# Patient Record
Sex: Female | Born: 1955
Health system: Southern US, Community
[De-identification: ages and names within clinical notes are randomized; demographics above are authoritative.]

## PROBLEM LIST (undated history)

## (undated) DIAGNOSIS — J42 Unspecified chronic bronchitis: Secondary | ICD-10-CM

## (undated) DIAGNOSIS — E782 Mixed hyperlipidemia: Secondary | ICD-10-CM

## (undated) DIAGNOSIS — Z8619 Personal history of other infectious and parasitic diseases: Secondary | ICD-10-CM

## (undated) DIAGNOSIS — E039 Hypothyroidism, unspecified: Secondary | ICD-10-CM

## (undated) DIAGNOSIS — R7303 Prediabetes: Secondary | ICD-10-CM

## (undated) HISTORY — DX: Unspecified chronic bronchitis: J42

## (undated) HISTORY — DX: Hypothyroidism, unspecified: E03.9

## (undated) HISTORY — DX: Prediabetes: R73.03

## (undated) HISTORY — DX: Mixed hyperlipidemia: E78.2

## (undated) HISTORY — DX: Personal history of other infectious and parasitic diseases: Z86.19

---

## 1997-10-17 HISTORY — PX: CERVICAL SPINE SURGERY: SHX589

## 1999-02-19 ENCOUNTER — Other Ambulatory Visit: Admission: RE | Admit: 1999-02-19 | Discharge: 1999-02-19 | Payer: Self-pay | Admitting: Obstetrics and Gynecology

## 2000-02-29 ENCOUNTER — Encounter: Admission: RE | Admit: 2000-02-29 | Discharge: 2000-02-29 | Payer: Self-pay | Admitting: Obstetrics and Gynecology

## 2000-02-29 ENCOUNTER — Encounter: Payer: Self-pay | Admitting: Obstetrics and Gynecology

## 2000-03-03 ENCOUNTER — Encounter: Payer: Self-pay | Admitting: Obstetrics and Gynecology

## 2000-03-03 ENCOUNTER — Encounter: Admission: RE | Admit: 2000-03-03 | Discharge: 2000-03-03 | Payer: Self-pay | Admitting: Obstetrics and Gynecology

## 2000-03-21 ENCOUNTER — Other Ambulatory Visit: Admission: RE | Admit: 2000-03-21 | Discharge: 2000-03-21 | Payer: Self-pay | Admitting: Family Medicine

## 2000-09-18 ENCOUNTER — Encounter: Payer: Self-pay | Admitting: Family Medicine

## 2000-09-18 ENCOUNTER — Encounter: Admission: RE | Admit: 2000-09-18 | Discharge: 2000-09-18 | Payer: Self-pay | Admitting: Family Medicine

## 2000-11-09 ENCOUNTER — Encounter: Admission: RE | Admit: 2000-11-09 | Discharge: 2000-11-09 | Payer: Self-pay | Admitting: Otolaryngology

## 2000-11-09 ENCOUNTER — Encounter: Payer: Self-pay | Admitting: Otolaryngology

## 2001-05-10 ENCOUNTER — Encounter: Payer: Self-pay | Admitting: Obstetrics and Gynecology

## 2001-05-10 ENCOUNTER — Encounter: Admission: RE | Admit: 2001-05-10 | Discharge: 2001-05-10 | Payer: Self-pay | Admitting: Obstetrics and Gynecology

## 2001-05-29 ENCOUNTER — Other Ambulatory Visit: Admission: RE | Admit: 2001-05-29 | Discharge: 2001-05-29 | Payer: Self-pay | Admitting: Obstetrics and Gynecology

## 2001-07-13 ENCOUNTER — Ambulatory Visit (HOSPITAL_COMMUNITY): Admission: RE | Admit: 2001-07-13 | Discharge: 2001-07-13 | Payer: Self-pay | Admitting: Obstetrics and Gynecology

## 2002-05-14 ENCOUNTER — Encounter: Admission: RE | Admit: 2002-05-14 | Discharge: 2002-05-14 | Payer: Self-pay | Admitting: Obstetrics and Gynecology

## 2002-05-14 ENCOUNTER — Encounter: Payer: Self-pay | Admitting: Obstetrics and Gynecology

## 2003-03-06 ENCOUNTER — Other Ambulatory Visit: Admission: RE | Admit: 2003-03-06 | Discharge: 2003-03-06 | Payer: Self-pay | Admitting: Obstetrics and Gynecology

## 2003-05-26 ENCOUNTER — Encounter: Admission: RE | Admit: 2003-05-26 | Discharge: 2003-05-26 | Payer: Self-pay | Admitting: Family Medicine

## 2003-05-26 ENCOUNTER — Encounter: Payer: Self-pay | Admitting: Family Medicine

## 2003-12-11 ENCOUNTER — Ambulatory Visit (HOSPITAL_COMMUNITY): Admission: RE | Admit: 2003-12-11 | Discharge: 2003-12-11 | Payer: Self-pay | Admitting: Neurosurgery

## 2004-06-22 ENCOUNTER — Other Ambulatory Visit: Admission: RE | Admit: 2004-06-22 | Discharge: 2004-06-22 | Payer: Self-pay | Admitting: Obstetrics and Gynecology

## 2004-08-30 ENCOUNTER — Encounter: Admission: RE | Admit: 2004-08-30 | Discharge: 2004-08-30 | Payer: Self-pay | Admitting: Obstetrics and Gynecology

## 2005-06-29 ENCOUNTER — Other Ambulatory Visit: Admission: RE | Admit: 2005-06-29 | Discharge: 2005-06-29 | Payer: Self-pay | Admitting: Obstetrics and Gynecology

## 2005-10-24 ENCOUNTER — Encounter: Admission: RE | Admit: 2005-10-24 | Discharge: 2005-10-24 | Payer: Self-pay | Admitting: Obstetrics and Gynecology

## 2006-11-02 ENCOUNTER — Encounter: Admission: RE | Admit: 2006-11-02 | Discharge: 2006-11-02 | Payer: Self-pay | Admitting: Obstetrics and Gynecology

## 2007-11-21 ENCOUNTER — Encounter: Admission: RE | Admit: 2007-11-21 | Discharge: 2007-11-21 | Payer: Self-pay | Admitting: Obstetrics and Gynecology

## 2008-11-24 ENCOUNTER — Encounter: Admission: RE | Admit: 2008-11-24 | Discharge: 2008-11-24 | Payer: Self-pay | Admitting: Obstetrics and Gynecology

## 2009-12-09 ENCOUNTER — Encounter: Admission: RE | Admit: 2009-12-09 | Discharge: 2009-12-09 | Payer: Self-pay | Admitting: Obstetrics and Gynecology

## 2010-12-23 ENCOUNTER — Other Ambulatory Visit: Payer: Self-pay | Admitting: Family Medicine

## 2010-12-23 DIAGNOSIS — Z1231 Encounter for screening mammogram for malignant neoplasm of breast: Secondary | ICD-10-CM

## 2010-12-30 ENCOUNTER — Ambulatory Visit
Admission: RE | Admit: 2010-12-30 | Discharge: 2010-12-30 | Disposition: A | Discharge status: Home / Self Care | Payer: BC Managed Care – PPO | Source: Ambulatory Visit | Attending: Family Medicine | Admitting: Family Medicine

## 2010-12-30 DIAGNOSIS — Z1231 Encounter for screening mammogram for malignant neoplasm of breast: Secondary | ICD-10-CM

## 2011-03-04 NOTE — Op Note (Signed)
[facility]  Patient:    Katherine Jackson, Katherine Jackson Visit Number: [id number] MRN: [id number]          Service Type: DSU Location: [facility] Attending Physician:  Miguel Aschoff Dictated by:   Miguel Aschoff, M.D. Proc. Date: 07/13/01 Admit Date:  07/13/2001                             Operative Report  PREOPERATIVE DIAGNOSES:       1. Chronic pelvic pain with dysmenorrhea.                               2. Desired sterilization.  POSTOPERATIVE DIAGNOSES:      1. Chronic pelvic pain with dysmenorrhea.                               2. Desired sterilization.  PROCEDURE:                    Diagnostic laparoscopy with bilateral tubal sterilization using cautery and division, laser uterosacral nerve ablation.  SURGEON:                      Miguel Aschoff, M.D.  ANESTHESIA:                   General.  COMPLICATIONS:                None.  JUSTIFICATION:                The patient is a 55 year old [place] female with a history of progressive dysmenorrhea that has not responded to outpatient therapy.  Because of her persistent symptoms she has requested that a definitive procedure be carried out to see if an etiology for the pain can be established and corrected.  In addition, she has requested that a tubal sterilization procedure be performed and that she now desires permanent sterilization.  Informed consent has been obtained for both procedures.  PROCEDURE:                    Patient was taken to the operating room, placed in a supine position, and general anesthesia was administered without difficulty.  She was then placed in the dorsal lithotomy position and prepped and draped in the usual sterile fashion.  A Hulka tenaculum was placed through the cervix and held.  The bladder was catheterized.  At this point a small infraumbilical incision was made.  The Veress needle was inserted and the abdomen was insufflated with 3 L of CO2 under monitored pressure.  Following this a  trocar was used and a subumbilical port established for the laparoscope.  Once in the abdomen a second puncture was created suprapubically under direct visualization with 5 mm port being established.  Systematic inspection of the pelvic organs showed the interior bladder/peritoneum to be unremarkable.  The round ligaments were identified and traced out to their exit points in the abdomen.  There appeared to be defects at the exit points consistent with small inguinal hernias, the right one being greater in size than the left.  The tubes were traced along their course.  The tubes were normal.  The fimbria were fine and delicate.  The ovaries were inspected and appeared to be unremarkable except for some filmy adhesions  present on the left ovary between the left ovary and the left tube.  These adhesions were ______ lysed.  The cul-de-sac was inspected.  There were no obvious lesions in the cul-de-sac.  The uterosacral ligaments were inspected.  There did not appear to be any obvious abnormality with the uterosacral ligaments.  The appendix was visualized as was the cecum and this appeared to be totally within normal limits.  The liver surface was visualized and also appeared to be within normal limits.  The intestinal surfaces were unremarkable.  At this point in an effort to try to improve the patients pain and with an ultrasound suggestive of adenomyosis the uterosacral ligaments were identified, placed on stretch, and then with 15 watts of laser power with a GRP4 laser tip the ligaments were partially transected.  This was done with care to avoid any injury to the ureter and with care to not create any hemorrhage.  This was done successfully.  Then attention was directed to the mid portion of each tube where using bipolar cautery the mid portion of each tube was cauterized for approximately 3 cm and then divided with scissors and the mid portion of the area cauterization with good  separation.  On the right side there was a small amount of oozing which was created on division of the tubes and this was brought under control using bipolar cautery without difficulty.  At this point with no other abnormalities being noted the procedure was completed.  The CO2 was allowed to escape.  The small incisions were closed using subcuticular 4-0 Vicryl at the umbilicus and Dermabond at the suprapubic incision.  The patient was reversed from the anesthetic and taken to the recovery room in satisfactory condition.  The estimated blood loss was less than 20 cc.  PLAN:                         Patient is to be discharged home.  She will be seen back in four weeks for followup examination.  She is to call for her intraoperative findings on Tuesday.  DISCHARGE MEDICATIONS:        1. Doxycycline 100 mg b.i.d. x 3 days.                               2. Tylox one q.3h. p.r.n. pain.  DISCHARGE INSTRUCTIONS:       She is instructed to also have no sex for two weeks to allow the tubal sterilization to heal. Dictated by:   Miguel Aschoff, M.D. Attending Physician:  Miguel Aschoff DD:  07/13/01 TD:  07/13/01 Job: [id number]

## 2012-01-17 ENCOUNTER — Other Ambulatory Visit: Payer: Self-pay | Admitting: Family Medicine

## 2012-01-17 DIAGNOSIS — Z1231 Encounter for screening mammogram for malignant neoplasm of breast: Secondary | ICD-10-CM

## 2012-01-26 ENCOUNTER — Ambulatory Visit
Admission: RE | Admit: 2012-01-26 | Discharge: 2012-01-26 | Disposition: A | Discharge status: Home / Self Care | Payer: BC Managed Care – PPO | Source: Ambulatory Visit | Attending: Family Medicine | Admitting: Family Medicine

## 2012-01-26 ENCOUNTER — Ambulatory Visit: Payer: BC Managed Care – PPO

## 2012-01-26 DIAGNOSIS — Z1231 Encounter for screening mammogram for malignant neoplasm of breast: Secondary | ICD-10-CM

## 2013-02-06 ENCOUNTER — Other Ambulatory Visit: Payer: Self-pay

## 2013-02-06 DIAGNOSIS — Z1231 Encounter for screening mammogram for malignant neoplasm of breast: Secondary | ICD-10-CM

## 2013-02-26 ENCOUNTER — Ambulatory Visit
Admission: RE | Admit: 2013-02-26 | Discharge: 2013-02-26 | Disposition: A | Discharge status: Home / Self Care | Payer: BC Managed Care – PPO | Source: Ambulatory Visit

## 2013-02-26 DIAGNOSIS — Z1231 Encounter for screening mammogram for malignant neoplasm of breast: Secondary | ICD-10-CM

## 2013-10-17 HISTORY — PX: BACK SURGERY: SHX140

## 2013-12-11 ENCOUNTER — Other Ambulatory Visit: Payer: Self-pay | Admitting: Family Medicine

## 2013-12-11 DIAGNOSIS — M545 Low back pain, unspecified: Secondary | ICD-10-CM

## 2013-12-18 ENCOUNTER — Other Ambulatory Visit: Payer: BC Managed Care – PPO

## 2013-12-23 ENCOUNTER — Other Ambulatory Visit: Payer: Self-pay | Admitting: Family Medicine

## 2013-12-23 ENCOUNTER — Ambulatory Visit
Admission: RE | Admit: 2013-12-23 | Discharge: 2013-12-23 | Disposition: A | Discharge status: Home / Self Care | Payer: BC Managed Care – PPO | Source: Ambulatory Visit | Attending: Family Medicine | Admitting: Family Medicine

## 2013-12-23 DIAGNOSIS — M7918 Myalgia, other site: Secondary | ICD-10-CM

## 2013-12-23 DIAGNOSIS — M545 Low back pain, unspecified: Secondary | ICD-10-CM

## 2014-01-01 ENCOUNTER — Other Ambulatory Visit: Payer: Self-pay | Admitting: Chiropractor

## 2014-01-01 DIAGNOSIS — M79606 Pain in leg, unspecified: Secondary | ICD-10-CM

## 2014-01-02 ENCOUNTER — Ambulatory Visit
Admission: RE | Admit: 2014-01-02 | Discharge: 2014-01-02 | Disposition: A | Discharge status: Home / Self Care | Payer: BC Managed Care – PPO | Source: Ambulatory Visit | Attending: Chiropractor | Admitting: Chiropractor

## 2014-01-02 DIAGNOSIS — M79606 Pain in leg, unspecified: Secondary | ICD-10-CM

## 2014-01-05 ENCOUNTER — Other Ambulatory Visit: Payer: BC Managed Care – PPO

## 2014-02-25 ENCOUNTER — Other Ambulatory Visit: Payer: Self-pay

## 2014-02-25 DIAGNOSIS — Z1231 Encounter for screening mammogram for malignant neoplasm of breast: Secondary | ICD-10-CM

## 2014-02-28 ENCOUNTER — Ambulatory Visit
Admission: RE | Admit: 2014-02-28 | Discharge: 2014-02-28 | Disposition: A | Discharge status: Home / Self Care | Payer: BC Managed Care – PPO | Source: Ambulatory Visit

## 2014-02-28 ENCOUNTER — Encounter (INDEPENDENT_AMBULATORY_CARE_PROVIDER_SITE_OTHER): Payer: Self-pay

## 2014-02-28 DIAGNOSIS — Z1231 Encounter for screening mammogram for malignant neoplasm of breast: Secondary | ICD-10-CM

## 2014-09-04 ENCOUNTER — Encounter (HOSPITAL_COMMUNITY): Payer: Self-pay

## 2014-09-04 ENCOUNTER — Ambulatory Visit (HOSPITAL_COMMUNITY)
Admission: RE | Admit: 2014-09-04 | Discharge: 2014-09-04 | Disposition: A | Discharge status: Home / Self Care | Payer: BC Managed Care – PPO | Source: Ambulatory Visit | Attending: Family Medicine | Admitting: Family Medicine

## 2014-09-04 ENCOUNTER — Ambulatory Visit
Admission: RE | Admit: 2014-09-04 | Discharge: 2014-09-04 | Disposition: A | Discharge status: Home / Self Care | Payer: BC Managed Care – PPO | Source: Ambulatory Visit | Attending: Physician Assistant | Admitting: Physician Assistant

## 2014-09-04 ENCOUNTER — Other Ambulatory Visit: Payer: Self-pay | Admitting: Family Medicine

## 2014-09-04 ENCOUNTER — Other Ambulatory Visit: Payer: Self-pay | Admitting: Physician Assistant

## 2014-09-04 ENCOUNTER — Encounter (INDEPENDENT_AMBULATORY_CARE_PROVIDER_SITE_OTHER): Payer: Self-pay

## 2014-09-04 DIAGNOSIS — R0602 Shortness of breath: Secondary | ICD-10-CM

## 2014-09-04 DIAGNOSIS — R0789 Other chest pain: Secondary | ICD-10-CM | POA: Diagnosis not present

## 2014-09-04 MED ORDER — IOHEXOL 350 MG/ML SOLN
100.0000 mL | Freq: Once | INTRAVENOUS | Status: AC | PRN
  Administered 2014-09-04: 100 mL via INTRAVENOUS

## 2014-09-10 ENCOUNTER — Other Ambulatory Visit: Payer: BC Managed Care – PPO

## 2015-01-23 ENCOUNTER — Other Ambulatory Visit: Payer: Self-pay

## 2015-01-23 DIAGNOSIS — Z1231 Encounter for screening mammogram for malignant neoplasm of breast: Secondary | ICD-10-CM

## 2015-01-23 DIAGNOSIS — Z803 Family history of malignant neoplasm of breast: Secondary | ICD-10-CM

## 2015-03-02 ENCOUNTER — Encounter (INDEPENDENT_AMBULATORY_CARE_PROVIDER_SITE_OTHER): Payer: Self-pay

## 2015-03-02 ENCOUNTER — Ambulatory Visit
Admission: RE | Admit: 2015-03-02 | Discharge: 2015-03-02 | Disposition: A | Discharge status: Home / Self Care | Payer: BLUE CROSS/BLUE SHIELD | Source: Ambulatory Visit

## 2015-03-02 DIAGNOSIS — Z803 Family history of malignant neoplasm of breast: Secondary | ICD-10-CM

## 2015-03-02 DIAGNOSIS — Z1231 Encounter for screening mammogram for malignant neoplasm of breast: Secondary | ICD-10-CM

## 2015-04-17 ENCOUNTER — Other Ambulatory Visit: Payer: Self-pay | Admitting: Family Medicine

## 2015-04-17 ENCOUNTER — Ambulatory Visit
Admission: RE | Admit: 2015-04-17 | Discharge: 2015-04-17 | Disposition: A | Discharge status: Home / Self Care | Payer: BLUE CROSS/BLUE SHIELD | Source: Ambulatory Visit | Attending: Family Medicine | Admitting: Family Medicine

## 2015-04-17 DIAGNOSIS — J219 Acute bronchiolitis, unspecified: Secondary | ICD-10-CM

## 2015-05-15 ENCOUNTER — Other Ambulatory Visit (HOSPITAL_COMMUNITY): Payer: Self-pay | Admitting: Respiratory Therapy

## 2015-05-15 DIAGNOSIS — J9809 Other diseases of bronchus, not elsewhere classified: Secondary | ICD-10-CM

## 2015-05-20 ENCOUNTER — Ambulatory Visit (HOSPITAL_COMMUNITY)
Admission: RE | Admit: 2015-05-20 | Discharge: 2015-05-20 | Disposition: A | Discharge status: Home / Self Care | Payer: BLUE CROSS/BLUE SHIELD | Source: Ambulatory Visit | Attending: Family Medicine | Admitting: Family Medicine

## 2015-05-20 DIAGNOSIS — J9801 Acute bronchospasm: Secondary | ICD-10-CM | POA: Insufficient documentation

## 2015-05-20 LAB — PULMONARY FUNCTION TEST
DL/VA % pred: 66 %
DL/VA: 3.47 ml/min/mmHg/L
DLCO UNC: 19.38 ml/min/mmHg
DLCO unc % pred: 65 %
FEF 25-75 POST: 2.29 L/s
FEF 25-75 Pre: 2.06 L/sec
FEF2575-%Change-Post: 11 %
FEF2575-%PRED-POST: 85 %
FEF2575-%PRED-PRE: 77 %
FEV1-%Change-Post: 4 %
FEV1-%PRED-POST: 95 %
FEV1-%PRED-PRE: 91 %
FEV1-POST: 2.86 L
FEV1-Pre: 2.74 L
FEV1FVC-%CHANGE-POST: 1 %
FEV1FVC-%Pred-Pre: 94 %
FEV6-%CHANGE-POST: 2 %
FEV6-%PRED-POST: 100 %
FEV6-%Pred-Pre: 98 %
FEV6-POST: 3.74 L
FEV6-Pre: 3.66 L
FEV6FVC-%Change-Post: 0 %
FEV6FVC-%PRED-PRE: 101 %
FEV6FVC-%Pred-Post: 100 %
FVC-%Change-Post: 3 %
FVC-%PRED-PRE: 96 %
FVC-%Pred-Post: 99 %
FVC-POST: 3.84 L
FVC-PRE: 3.73 L
PRE FEV1/FVC RATIO: 73 %
Post FEV1/FVC ratio: 75 %
Post FEV6/FVC ratio: 97 %
Pre FEV6/FVC Ratio: 98 %
RV % PRED: 80 %
RV: 1.74 L
TLC % PRED: 97 %
TLC: 5.53 L

## 2015-05-20 MED ORDER — ALBUTEROL SULFATE (2.5 MG/3ML) 0.083% IN NEBU
2.5000 mg | INHALATION_SOLUTION | Freq: Once | RESPIRATORY_TRACT | Status: AC
  Administered 2015-05-20: 2.5 mg via RESPIRATORY_TRACT

## 2015-09-16 ENCOUNTER — Other Ambulatory Visit: Payer: Self-pay | Admitting: Neurology

## 2015-09-16 MED ORDER — TRIAMCINOLONE ACETONIDE 55 MCG/ACT NA AERO: 1.0000 | INHALATION_SPRAY | Freq: Every day | NASAL | Status: DC

## 2016-02-19 ENCOUNTER — Other Ambulatory Visit: Payer: Self-pay

## 2016-02-19 DIAGNOSIS — Z1231 Encounter for screening mammogram for malignant neoplasm of breast: Secondary | ICD-10-CM

## 2016-03-02 ENCOUNTER — Ambulatory Visit: Payer: BLUE CROSS/BLUE SHIELD

## 2016-03-21 ENCOUNTER — Ambulatory Visit
Admission: RE | Admit: 2016-03-21 | Discharge: 2016-03-21 | Disposition: A | Discharge status: Home / Self Care | Payer: BLUE CROSS/BLUE SHIELD | Source: Ambulatory Visit

## 2016-03-21 DIAGNOSIS — Z1231 Encounter for screening mammogram for malignant neoplasm of breast: Secondary | ICD-10-CM

## 2016-03-23 ENCOUNTER — Other Ambulatory Visit: Payer: Self-pay | Admitting: Family Medicine

## 2016-03-23 DIAGNOSIS — R928 Other abnormal and inconclusive findings on diagnostic imaging of breast: Secondary | ICD-10-CM

## 2016-03-28 ENCOUNTER — Ambulatory Visit
Admission: RE | Admit: 2016-03-28 | Discharge: 2016-03-28 | Disposition: A | Discharge status: Home / Self Care | Payer: BLUE CROSS/BLUE SHIELD | Source: Ambulatory Visit | Attending: Family Medicine | Admitting: Family Medicine

## 2016-03-28 DIAGNOSIS — R928 Other abnormal and inconclusive findings on diagnostic imaging of breast: Secondary | ICD-10-CM

## 2016-08-24 ENCOUNTER — Other Ambulatory Visit: Payer: Self-pay | Admitting: Family Medicine

## 2016-08-24 DIAGNOSIS — N6489 Other specified disorders of breast: Secondary | ICD-10-CM

## 2016-09-20 ENCOUNTER — Ambulatory Visit
Admission: RE | Admit: 2016-09-20 | Discharge: 2016-09-20 | Disposition: A | Discharge status: Home / Self Care | Payer: BLUE CROSS/BLUE SHIELD | Source: Ambulatory Visit | Attending: Family Medicine | Admitting: Family Medicine

## 2016-09-20 DIAGNOSIS — N6489 Other specified disorders of breast: Secondary | ICD-10-CM

## 2016-11-03 DIAGNOSIS — M5432 Sciatica, left side: Secondary | ICD-10-CM | POA: Diagnosis not present

## 2016-11-03 DIAGNOSIS — M9903 Segmental and somatic dysfunction of lumbar region: Secondary | ICD-10-CM | POA: Diagnosis not present

## 2016-11-10 ENCOUNTER — Other Ambulatory Visit: Payer: Self-pay | Admitting: Physician Assistant

## 2016-11-10 ENCOUNTER — Ambulatory Visit
Admission: RE | Admit: 2016-11-10 | Discharge: 2016-11-10 | Disposition: A | Discharge status: Home / Self Care | Payer: 59 | Source: Ambulatory Visit | Attending: Physician Assistant | Admitting: Physician Assistant

## 2016-11-10 DIAGNOSIS — J069 Acute upper respiratory infection, unspecified: Secondary | ICD-10-CM

## 2016-11-10 DIAGNOSIS — R059 Cough, unspecified: Secondary | ICD-10-CM

## 2016-11-10 DIAGNOSIS — R05 Cough: Secondary | ICD-10-CM

## 2016-11-25 DIAGNOSIS — J329 Chronic sinusitis, unspecified: Secondary | ICD-10-CM | POA: Diagnosis not present

## 2016-12-12 DIAGNOSIS — R7301 Impaired fasting glucose: Secondary | ICD-10-CM | POA: Diagnosis not present

## 2016-12-12 DIAGNOSIS — E782 Mixed hyperlipidemia: Secondary | ICD-10-CM | POA: Diagnosis not present

## 2016-12-12 DIAGNOSIS — E038 Other specified hypothyroidism: Secondary | ICD-10-CM | POA: Diagnosis not present

## 2017-01-13 DIAGNOSIS — R197 Diarrhea, unspecified: Secondary | ICD-10-CM | POA: Diagnosis not present

## 2017-01-23 DIAGNOSIS — R05 Cough: Secondary | ICD-10-CM | POA: Diagnosis not present

## 2017-03-27 DIAGNOSIS — R5383 Other fatigue: Secondary | ICD-10-CM | POA: Diagnosis not present

## 2017-03-27 DIAGNOSIS — E039 Hypothyroidism, unspecified: Secondary | ICD-10-CM | POA: Diagnosis not present

## 2017-03-27 DIAGNOSIS — L659 Nonscarring hair loss, unspecified: Secondary | ICD-10-CM | POA: Diagnosis not present

## 2017-04-13 DIAGNOSIS — R05 Cough: Secondary | ICD-10-CM | POA: Diagnosis not present

## 2017-04-13 DIAGNOSIS — J3089 Other allergic rhinitis: Secondary | ICD-10-CM | POA: Diagnosis not present

## 2017-04-13 DIAGNOSIS — J4540 Moderate persistent asthma, uncomplicated: Secondary | ICD-10-CM | POA: Diagnosis not present

## 2017-05-02 DIAGNOSIS — E038 Other specified hypothyroidism: Secondary | ICD-10-CM | POA: Diagnosis not present

## 2017-05-12 ENCOUNTER — Other Ambulatory Visit: Payer: Self-pay | Admitting: Family Medicine

## 2017-05-12 DIAGNOSIS — Z1231 Encounter for screening mammogram for malignant neoplasm of breast: Secondary | ICD-10-CM

## 2017-05-17 ENCOUNTER — Ambulatory Visit
Admission: RE | Admit: 2017-05-17 | Discharge: 2017-05-17 | Disposition: A | Discharge status: Home / Self Care | Payer: 59 | Source: Ambulatory Visit | Attending: Family Medicine | Admitting: Family Medicine

## 2017-05-17 DIAGNOSIS — Z1231 Encounter for screening mammogram for malignant neoplasm of breast: Secondary | ICD-10-CM

## 2017-05-31 IMAGING — CR DG CHEST 2V
2 series · 2 of 2 positions shown · non-contrast
Comparison: Chest radiograph and chest CT September 04, 2014

CLINICAL DATA: Three-week history of cough.  Chest tightness

EXAM:
CHEST  2 VIEW

[w chest pa]
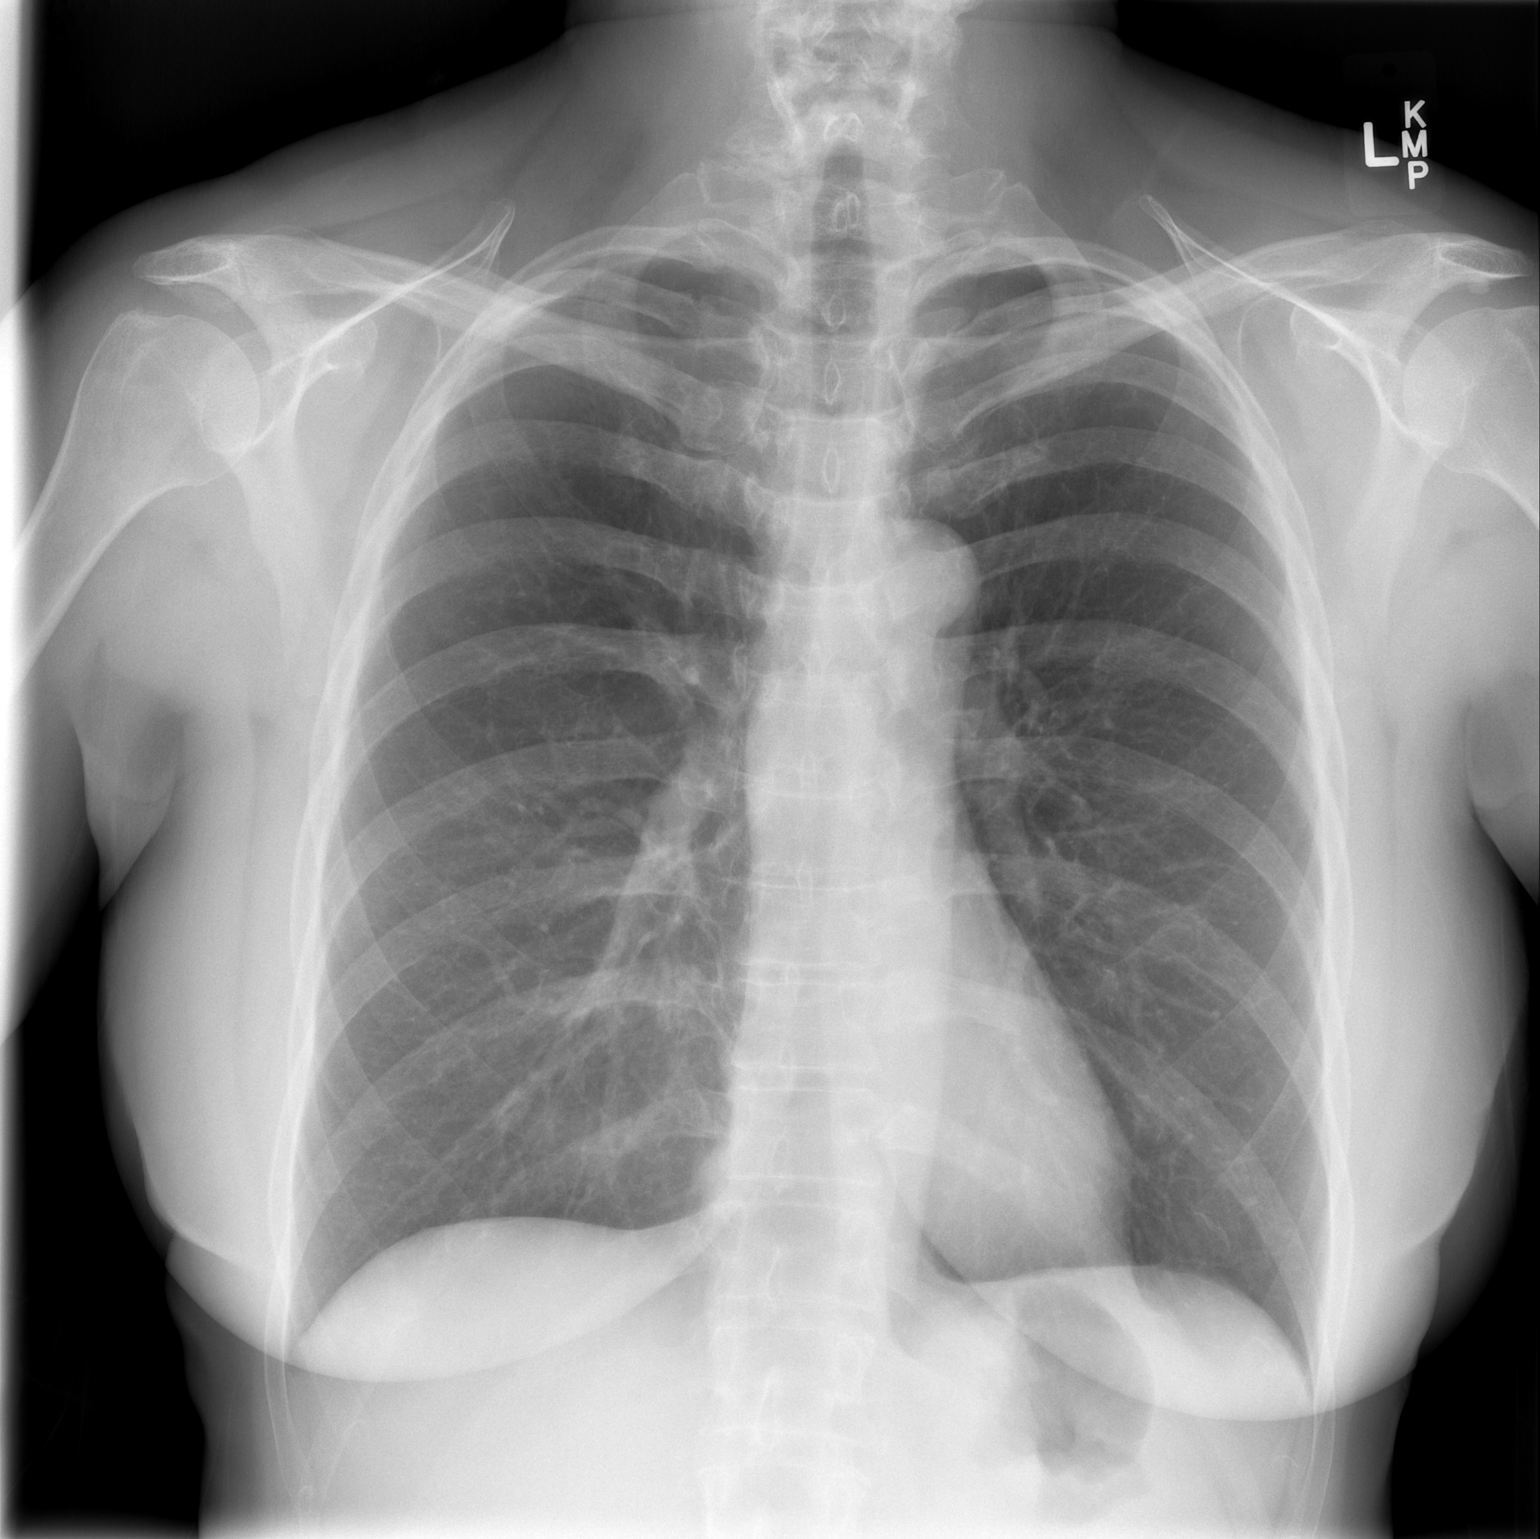

[w chest lat]
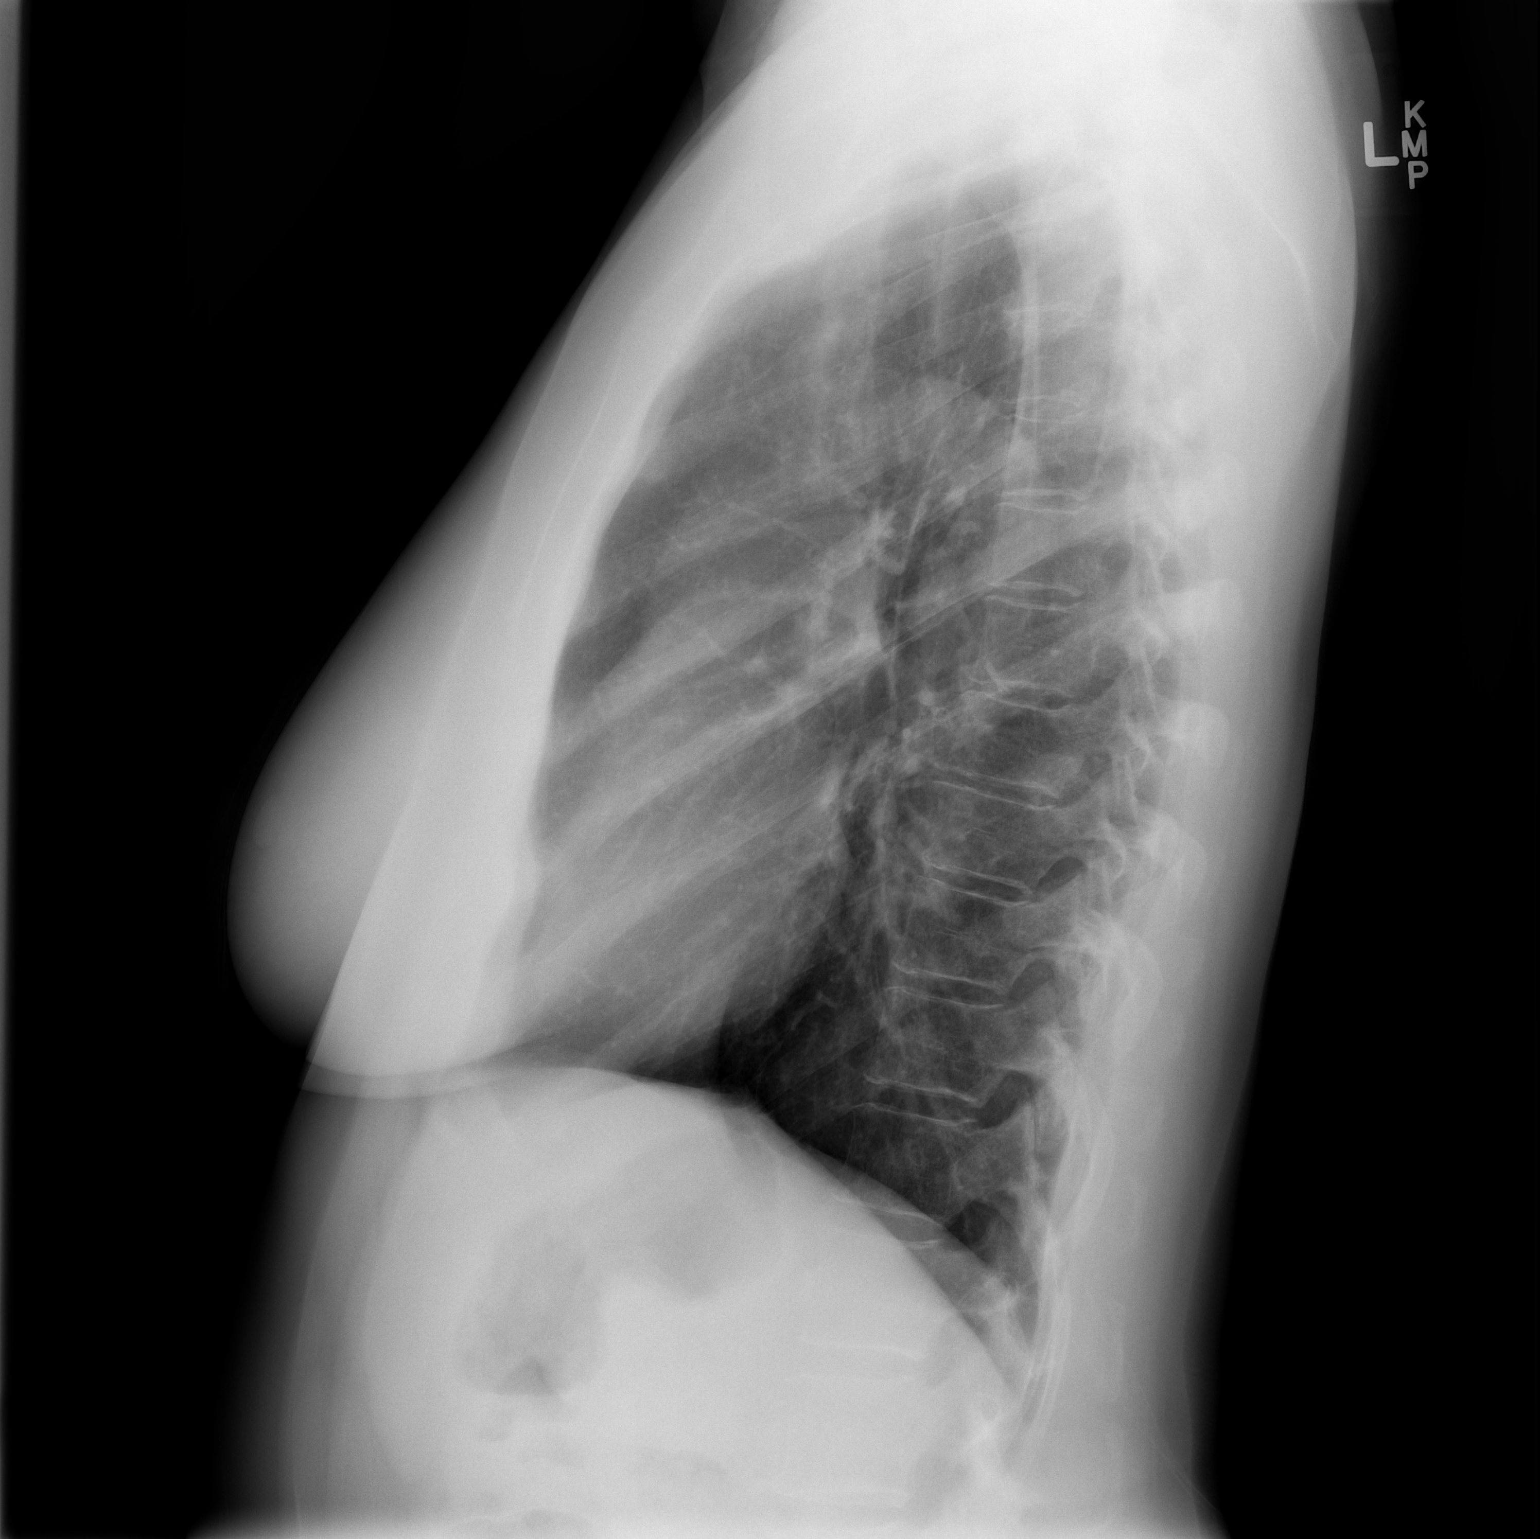

[2 of 2 positions shown; findings below may reference images not displayed]

FINDINGS: Lungs are clear. Heart size and pulmonary vascularity are normal. No
adenopathy. No bone lesions.
IMPRESSION: No edema or consolidation.

## 2017-06-26 DIAGNOSIS — I10 Essential (primary) hypertension: Secondary | ICD-10-CM | POA: Diagnosis not present

## 2017-06-26 DIAGNOSIS — E039 Hypothyroidism, unspecified: Secondary | ICD-10-CM | POA: Diagnosis not present

## 2017-06-26 DIAGNOSIS — Z0000 Encounter for general adult medical examination without abnormal findings: Secondary | ICD-10-CM | POA: Diagnosis not present

## 2017-06-26 DIAGNOSIS — R7301 Impaired fasting glucose: Secondary | ICD-10-CM | POA: Diagnosis not present

## 2017-07-11 DIAGNOSIS — Z23 Encounter for immunization: Secondary | ICD-10-CM | POA: Diagnosis not present

## 2017-07-31 DIAGNOSIS — M8588 Other specified disorders of bone density and structure, other site: Secondary | ICD-10-CM | POA: Diagnosis not present

## 2017-08-04 DIAGNOSIS — N390 Urinary tract infection, site not specified: Secondary | ICD-10-CM | POA: Diagnosis not present

## 2017-08-21 DIAGNOSIS — Z01419 Encounter for gynecological examination (general) (routine) without abnormal findings: Secondary | ICD-10-CM | POA: Diagnosis not present

## 2017-08-23 DIAGNOSIS — E038 Other specified hypothyroidism: Secondary | ICD-10-CM | POA: Diagnosis not present

## 2017-12-15 DIAGNOSIS — D229 Melanocytic nevi, unspecified: Secondary | ICD-10-CM | POA: Diagnosis not present

## 2017-12-15 DIAGNOSIS — L570 Actinic keratosis: Secondary | ICD-10-CM | POA: Diagnosis not present

## 2017-12-25 DIAGNOSIS — I10 Essential (primary) hypertension: Secondary | ICD-10-CM | POA: Diagnosis not present

## 2017-12-25 DIAGNOSIS — R7301 Impaired fasting glucose: Secondary | ICD-10-CM | POA: Diagnosis not present

## 2017-12-25 DIAGNOSIS — E782 Mixed hyperlipidemia: Secondary | ICD-10-CM | POA: Diagnosis not present

## 2017-12-25 DIAGNOSIS — E039 Hypothyroidism, unspecified: Secondary | ICD-10-CM | POA: Diagnosis not present

## 2018-01-22 DIAGNOSIS — Z23 Encounter for immunization: Secondary | ICD-10-CM | POA: Diagnosis not present

## 2018-04-12 ENCOUNTER — Other Ambulatory Visit: Payer: Self-pay | Admitting: Obstetrics & Gynecology

## 2018-04-12 DIAGNOSIS — Z1231 Encounter for screening mammogram for malignant neoplasm of breast: Secondary | ICD-10-CM

## 2018-05-21 ENCOUNTER — Ambulatory Visit
Admission: RE | Admit: 2018-05-21 | Discharge: 2018-05-21 | Disposition: A | Discharge status: Home / Self Care | Payer: 59 | Source: Ambulatory Visit | Attending: Obstetrics & Gynecology | Admitting: Obstetrics & Gynecology

## 2018-05-21 DIAGNOSIS — Z1231 Encounter for screening mammogram for malignant neoplasm of breast: Secondary | ICD-10-CM | POA: Diagnosis not present

## 2018-06-07 DIAGNOSIS — L570 Actinic keratosis: Secondary | ICD-10-CM | POA: Diagnosis not present

## 2018-06-07 DIAGNOSIS — X32XXXA Exposure to sunlight, initial encounter: Secondary | ICD-10-CM | POA: Diagnosis not present

## 2018-06-28 DIAGNOSIS — I10 Essential (primary) hypertension: Secondary | ICD-10-CM | POA: Diagnosis not present

## 2018-06-28 DIAGNOSIS — Z0000 Encounter for general adult medical examination without abnormal findings: Secondary | ICD-10-CM | POA: Diagnosis not present

## 2018-06-28 DIAGNOSIS — E039 Hypothyroidism, unspecified: Secondary | ICD-10-CM | POA: Diagnosis not present

## 2018-06-28 DIAGNOSIS — E782 Mixed hyperlipidemia: Secondary | ICD-10-CM | POA: Diagnosis not present

## 2018-08-28 DIAGNOSIS — Z01419 Encounter for gynecological examination (general) (routine) without abnormal findings: Secondary | ICD-10-CM | POA: Diagnosis not present

## 2018-08-28 DIAGNOSIS — Z124 Encounter for screening for malignant neoplasm of cervix: Secondary | ICD-10-CM | POA: Diagnosis not present

## 2018-10-04 DIAGNOSIS — L570 Actinic keratosis: Secondary | ICD-10-CM | POA: Diagnosis not present

## 2018-10-04 DIAGNOSIS — X32XXXD Exposure to sunlight, subsequent encounter: Secondary | ICD-10-CM | POA: Diagnosis not present

## 2018-10-15 DIAGNOSIS — R0981 Nasal congestion: Secondary | ICD-10-CM | POA: Diagnosis not present

## 2018-10-15 DIAGNOSIS — R05 Cough: Secondary | ICD-10-CM | POA: Diagnosis not present

## 2018-11-22 DIAGNOSIS — L820 Inflamed seborrheic keratosis: Secondary | ICD-10-CM | POA: Diagnosis not present

## 2018-11-27 DIAGNOSIS — R6882 Decreased libido: Secondary | ICD-10-CM | POA: Diagnosis not present

## 2018-12-26 DIAGNOSIS — R7301 Impaired fasting glucose: Secondary | ICD-10-CM | POA: Diagnosis not present

## 2018-12-26 DIAGNOSIS — E782 Mixed hyperlipidemia: Secondary | ICD-10-CM | POA: Diagnosis not present

## 2018-12-26 DIAGNOSIS — E039 Hypothyroidism, unspecified: Secondary | ICD-10-CM | POA: Diagnosis not present

## 2018-12-27 DIAGNOSIS — H00014 Hordeolum externum left upper eyelid: Secondary | ICD-10-CM | POA: Diagnosis not present

## 2019-07-23 ENCOUNTER — Other Ambulatory Visit: Payer: Self-pay | Admitting: Obstetrics & Gynecology

## 2019-07-23 DIAGNOSIS — Z1231 Encounter for screening mammogram for malignant neoplasm of breast: Secondary | ICD-10-CM

## 2019-09-09 ENCOUNTER — Ambulatory Visit
Admission: RE | Admit: 2019-09-09 | Discharge: 2019-09-09 | Disposition: A | Discharge status: Home / Self Care | Payer: 59 | Source: Ambulatory Visit | Attending: Obstetrics & Gynecology | Admitting: Obstetrics & Gynecology

## 2019-09-09 ENCOUNTER — Other Ambulatory Visit: Payer: Self-pay

## 2019-09-09 DIAGNOSIS — Z1231 Encounter for screening mammogram for malignant neoplasm of breast: Secondary | ICD-10-CM

## 2020-08-05 ENCOUNTER — Other Ambulatory Visit: Payer: Self-pay | Admitting: Family Medicine

## 2020-08-05 DIAGNOSIS — Z1231 Encounter for screening mammogram for malignant neoplasm of breast: Secondary | ICD-10-CM

## 2020-08-17 ENCOUNTER — Other Ambulatory Visit: Payer: Self-pay | Admitting: Family Medicine

## 2020-08-17 DIAGNOSIS — M8580 Other specified disorders of bone density and structure, unspecified site: Secondary | ICD-10-CM

## 2020-09-14 ENCOUNTER — Other Ambulatory Visit: Payer: Self-pay

## 2020-09-14 ENCOUNTER — Ambulatory Visit
Admission: RE | Admit: 2020-09-14 | Discharge: 2020-09-14 | Disposition: A | Discharge status: Home / Self Care | Payer: 59 | Source: Ambulatory Visit | Attending: Family Medicine | Admitting: Family Medicine

## 2020-09-14 DIAGNOSIS — Z1231 Encounter for screening mammogram for malignant neoplasm of breast: Secondary | ICD-10-CM

## 2020-11-26 ENCOUNTER — Other Ambulatory Visit: Payer: 59

## 2020-11-27 ENCOUNTER — Other Ambulatory Visit: Payer: 59

## 2020-12-02 ENCOUNTER — Ambulatory Visit
Admission: RE | Admit: 2020-12-02 | Discharge: 2020-12-02 | Disposition: A | Discharge status: Home / Self Care | Payer: 59 | Source: Ambulatory Visit | Attending: Family Medicine | Admitting: Family Medicine

## 2020-12-02 ENCOUNTER — Other Ambulatory Visit: Payer: Self-pay

## 2020-12-02 DIAGNOSIS — M8580 Other specified disorders of bone density and structure, unspecified site: Secondary | ICD-10-CM

## 2021-08-02 ENCOUNTER — Other Ambulatory Visit: Payer: Self-pay | Admitting: Family Medicine

## 2021-08-02 DIAGNOSIS — Z1231 Encounter for screening mammogram for malignant neoplasm of breast: Secondary | ICD-10-CM

## 2021-09-15 ENCOUNTER — Ambulatory Visit
Admission: RE | Admit: 2021-09-15 | Discharge: 2021-09-15 | Disposition: A | Discharge status: Home / Self Care | Payer: 59 | Source: Ambulatory Visit | Attending: Family Medicine | Admitting: Family Medicine

## 2021-09-15 DIAGNOSIS — Z1231 Encounter for screening mammogram for malignant neoplasm of breast: Secondary | ICD-10-CM

## 2022-08-17 ENCOUNTER — Other Ambulatory Visit: Payer: Self-pay | Admitting: Family Medicine

## 2022-08-17 DIAGNOSIS — Z1231 Encounter for screening mammogram for malignant neoplasm of breast: Secondary | ICD-10-CM

## 2022-08-29 ENCOUNTER — Other Ambulatory Visit: Payer: Self-pay | Admitting: Family Medicine

## 2022-08-29 DIAGNOSIS — I10 Essential (primary) hypertension: Secondary | ICD-10-CM | POA: Diagnosis not present

## 2022-08-29 DIAGNOSIS — M8580 Other specified disorders of bone density and structure, unspecified site: Secondary | ICD-10-CM

## 2022-08-29 DIAGNOSIS — E782 Mixed hyperlipidemia: Secondary | ICD-10-CM | POA: Diagnosis not present

## 2022-08-29 DIAGNOSIS — R7303 Prediabetes: Secondary | ICD-10-CM | POA: Diagnosis not present

## 2022-08-29 DIAGNOSIS — J42 Unspecified chronic bronchitis: Secondary | ICD-10-CM | POA: Diagnosis not present

## 2022-08-29 DIAGNOSIS — E039 Hypothyroidism, unspecified: Secondary | ICD-10-CM | POA: Diagnosis not present

## 2022-08-29 DIAGNOSIS — Z1159 Encounter for screening for other viral diseases: Secondary | ICD-10-CM | POA: Diagnosis not present

## 2022-08-29 DIAGNOSIS — Z23 Encounter for immunization: Secondary | ICD-10-CM | POA: Diagnosis not present

## 2022-08-29 DIAGNOSIS — Z0000 Encounter for general adult medical examination without abnormal findings: Secondary | ICD-10-CM | POA: Diagnosis not present

## 2022-08-29 DIAGNOSIS — Z1231 Encounter for screening mammogram for malignant neoplasm of breast: Secondary | ICD-10-CM

## 2022-10-14 ENCOUNTER — Ambulatory Visit
Admission: RE | Admit: 2022-10-14 | Discharge: 2022-10-14 | Disposition: A | Discharge status: Home / Self Care | Payer: Medicare Other | Source: Ambulatory Visit | Attending: Family Medicine | Admitting: Family Medicine

## 2022-10-14 DIAGNOSIS — Z1231 Encounter for screening mammogram for malignant neoplasm of breast: Secondary | ICD-10-CM

## 2022-11-11 DIAGNOSIS — J0140 Acute pansinusitis, unspecified: Secondary | ICD-10-CM | POA: Diagnosis not present

## 2022-11-17 ENCOUNTER — Other Ambulatory Visit: Payer: Self-pay | Admitting: Family Medicine

## 2022-11-17 DIAGNOSIS — J3489 Other specified disorders of nose and nasal sinuses: Secondary | ICD-10-CM

## 2022-11-21 DIAGNOSIS — Z6824 Body mass index (BMI) 24.0-24.9, adult: Secondary | ICD-10-CM | POA: Diagnosis not present

## 2022-11-21 DIAGNOSIS — Z01419 Encounter for gynecological examination (general) (routine) without abnormal findings: Secondary | ICD-10-CM | POA: Diagnosis not present

## 2022-12-06 ENCOUNTER — Ambulatory Visit
Admission: RE | Admit: 2022-12-06 | Discharge: 2022-12-06 | Disposition: A | Discharge status: Home / Self Care | Payer: HMO | Source: Ambulatory Visit | Attending: Family Medicine | Admitting: Family Medicine

## 2022-12-06 DIAGNOSIS — M8580 Other specified disorders of bone density and structure, unspecified site: Secondary | ICD-10-CM

## 2022-12-06 DIAGNOSIS — Z780 Asymptomatic menopausal state: Secondary | ICD-10-CM | POA: Diagnosis not present

## 2022-12-06 DIAGNOSIS — M8589 Other specified disorders of bone density and structure, multiple sites: Secondary | ICD-10-CM | POA: Diagnosis not present

## 2022-12-09 ENCOUNTER — Ambulatory Visit
Admission: RE | Admit: 2022-12-09 | Discharge: 2022-12-09 | Disposition: A | Discharge status: Home / Self Care | Payer: HMO | Source: Ambulatory Visit | Attending: Family Medicine | Admitting: Family Medicine

## 2022-12-09 DIAGNOSIS — J329 Chronic sinusitis, unspecified: Secondary | ICD-10-CM | POA: Diagnosis not present

## 2022-12-09 DIAGNOSIS — J3489 Other specified disorders of nose and nasal sinuses: Secondary | ICD-10-CM

## 2022-12-15 ENCOUNTER — Other Ambulatory Visit: Payer: Medicare Other

## 2022-12-30 DIAGNOSIS — H5213 Myopia, bilateral: Secondary | ICD-10-CM | POA: Diagnosis not present

## 2022-12-30 DIAGNOSIS — H52223 Regular astigmatism, bilateral: Secondary | ICD-10-CM | POA: Diagnosis not present

## 2023-02-27 DIAGNOSIS — E782 Mixed hyperlipidemia: Secondary | ICD-10-CM | POA: Diagnosis not present

## 2023-02-27 DIAGNOSIS — R7303 Prediabetes: Secondary | ICD-10-CM | POA: Diagnosis not present

## 2023-03-27 DIAGNOSIS — R300 Dysuria: Secondary | ICD-10-CM | POA: Diagnosis not present

## 2023-03-27 DIAGNOSIS — R6882 Decreased libido: Secondary | ICD-10-CM | POA: Diagnosis not present

## 2023-05-10 DIAGNOSIS — K146 Glossodynia: Secondary | ICD-10-CM | POA: Diagnosis not present

## 2023-05-10 DIAGNOSIS — K1379 Other lesions of oral mucosa: Secondary | ICD-10-CM | POA: Diagnosis not present

## 2023-06-15 DIAGNOSIS — H6122 Impacted cerumen, left ear: Secondary | ICD-10-CM | POA: Diagnosis not present

## 2023-07-04 DIAGNOSIS — R6882 Decreased libido: Secondary | ICD-10-CM | POA: Diagnosis not present

## 2023-07-06 DIAGNOSIS — G244 Idiopathic orofacial dystonia: Secondary | ICD-10-CM | POA: Diagnosis not present

## 2023-07-06 DIAGNOSIS — K146 Glossodynia: Secondary | ICD-10-CM | POA: Diagnosis not present

## 2023-09-01 DIAGNOSIS — Z0000 Encounter for general adult medical examination without abnormal findings: Secondary | ICD-10-CM | POA: Diagnosis not present

## 2023-09-01 DIAGNOSIS — Z23 Encounter for immunization: Secondary | ICD-10-CM | POA: Diagnosis not present

## 2023-09-01 DIAGNOSIS — E039 Hypothyroidism, unspecified: Secondary | ICD-10-CM | POA: Diagnosis not present

## 2023-09-01 DIAGNOSIS — K146 Glossodynia: Secondary | ICD-10-CM | POA: Diagnosis not present

## 2023-09-01 DIAGNOSIS — J42 Unspecified chronic bronchitis: Secondary | ICD-10-CM | POA: Diagnosis not present

## 2023-09-01 DIAGNOSIS — M8580 Other specified disorders of bone density and structure, unspecified site: Secondary | ICD-10-CM | POA: Diagnosis not present

## 2023-09-01 DIAGNOSIS — I700 Atherosclerosis of aorta: Secondary | ICD-10-CM | POA: Diagnosis not present

## 2023-09-01 DIAGNOSIS — E782 Mixed hyperlipidemia: Secondary | ICD-10-CM | POA: Diagnosis not present

## 2023-09-01 DIAGNOSIS — R7303 Prediabetes: Secondary | ICD-10-CM | POA: Diagnosis not present

## 2023-09-01 DIAGNOSIS — G4700 Insomnia, unspecified: Secondary | ICD-10-CM | POA: Diagnosis not present

## 2023-09-01 DIAGNOSIS — I10 Essential (primary) hypertension: Secondary | ICD-10-CM | POA: Diagnosis not present

## 2023-09-12 ENCOUNTER — Other Ambulatory Visit: Payer: Self-pay | Admitting: Family Medicine

## 2023-09-12 DIAGNOSIS — Z1231 Encounter for screening mammogram for malignant neoplasm of breast: Secondary | ICD-10-CM

## 2023-10-17 ENCOUNTER — Ambulatory Visit
Admission: RE | Admit: 2023-10-17 | Discharge: 2023-10-17 | Disposition: A | Discharge status: Home / Self Care | Payer: HMO | Source: Ambulatory Visit | Attending: Family Medicine | Admitting: Family Medicine

## 2023-10-17 DIAGNOSIS — Z1231 Encounter for screening mammogram for malignant neoplasm of breast: Secondary | ICD-10-CM

## 2023-10-24 ENCOUNTER — Other Ambulatory Visit: Payer: Self-pay | Admitting: Family Medicine

## 2023-10-24 DIAGNOSIS — R928 Other abnormal and inconclusive findings on diagnostic imaging of breast: Secondary | ICD-10-CM

## 2023-10-30 IMAGING — MG MM DIGITAL SCREENING BILAT W/ TOMO AND CAD
8 series · 8 of 24 positions shown · non-contrast
Comparison: Previous exam(s).

CLINICAL DATA: Screening.

EXAM:
DIGITAL SCREENING BILATERAL MAMMOGRAM WITH TOMOSYNTHESIS AND CAD
TECHNIQUE: Bilateral screening digital craniocaudal and mediolateral oblique
mammograms were obtained. Bilateral screening digital breast
tomosynthesis was performed. The images were evaluated with
computer-aided detection.

[L CC synth-2D]
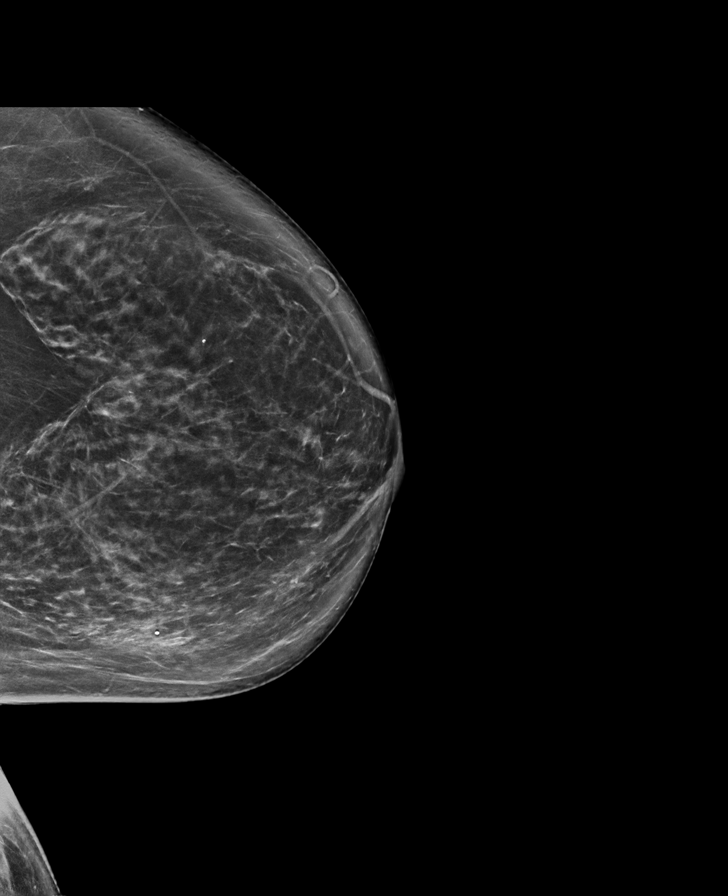

[L MLO synth-2D]
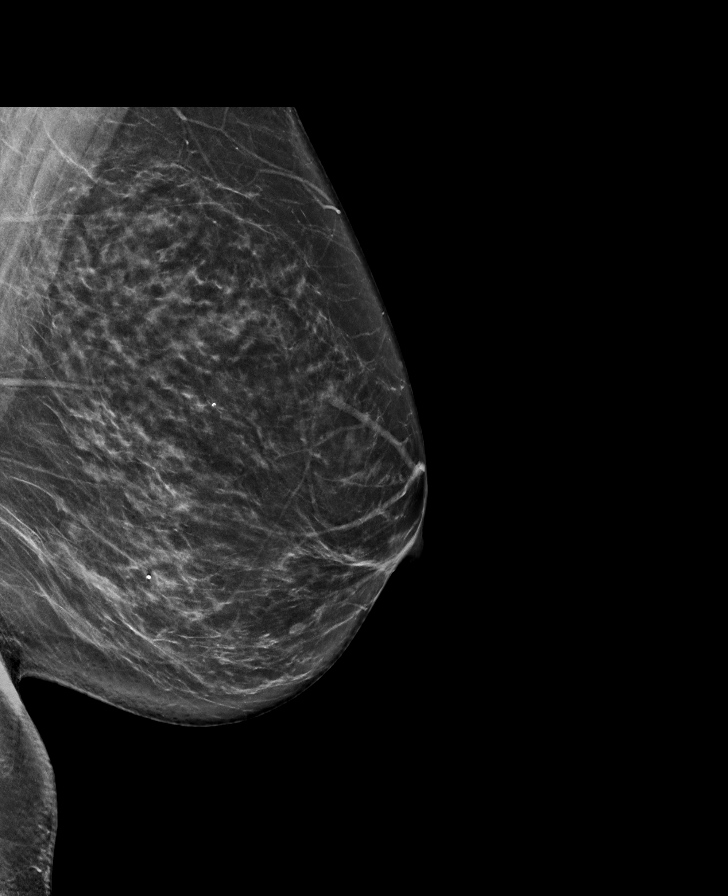

[R MLO synth-2D]
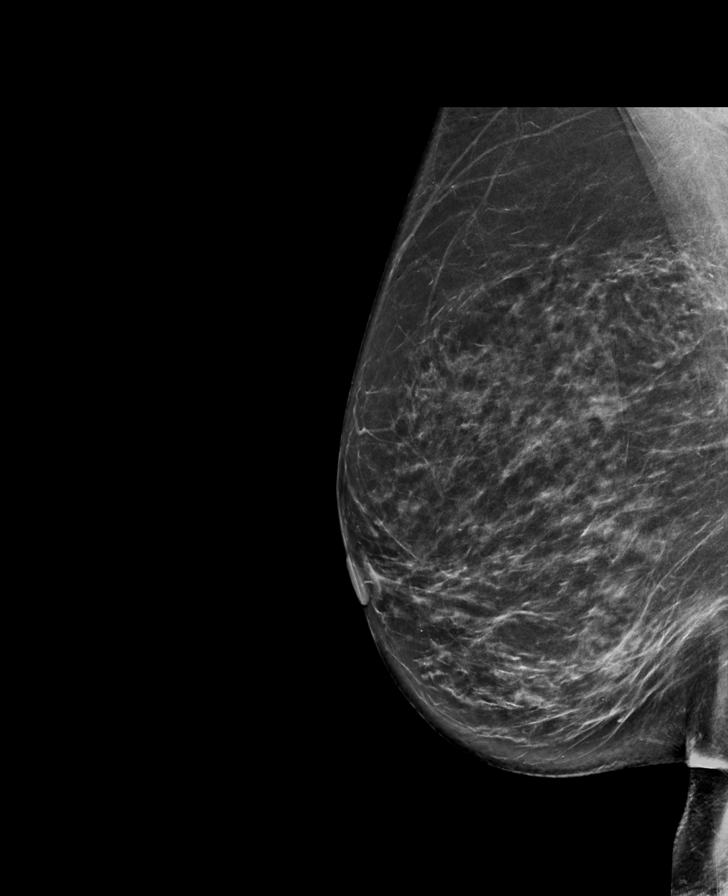

[R CC synth-2D]
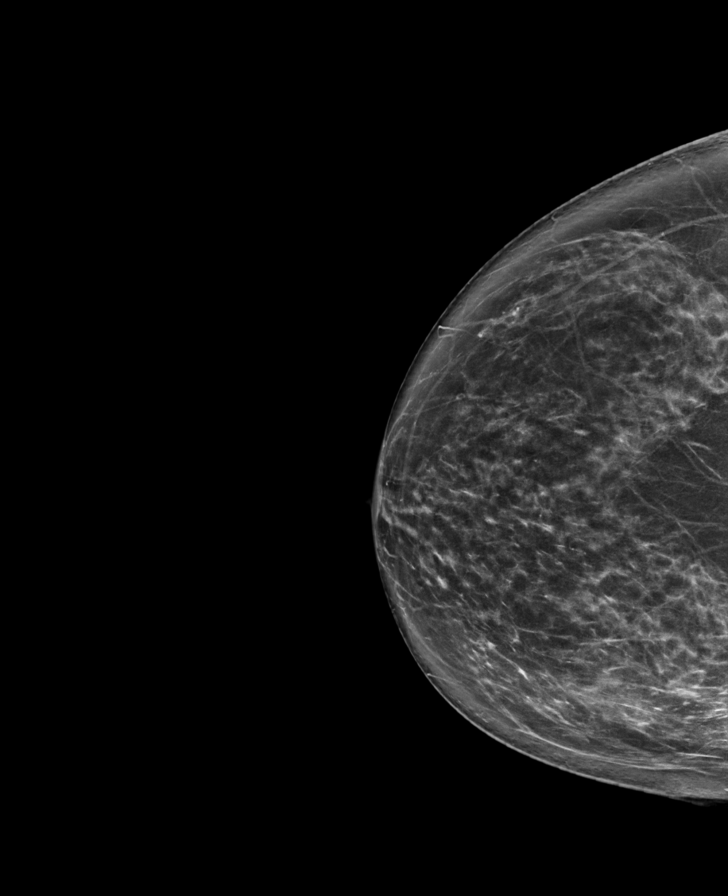

[R MLO tomo · tomo slice 43/84.0]
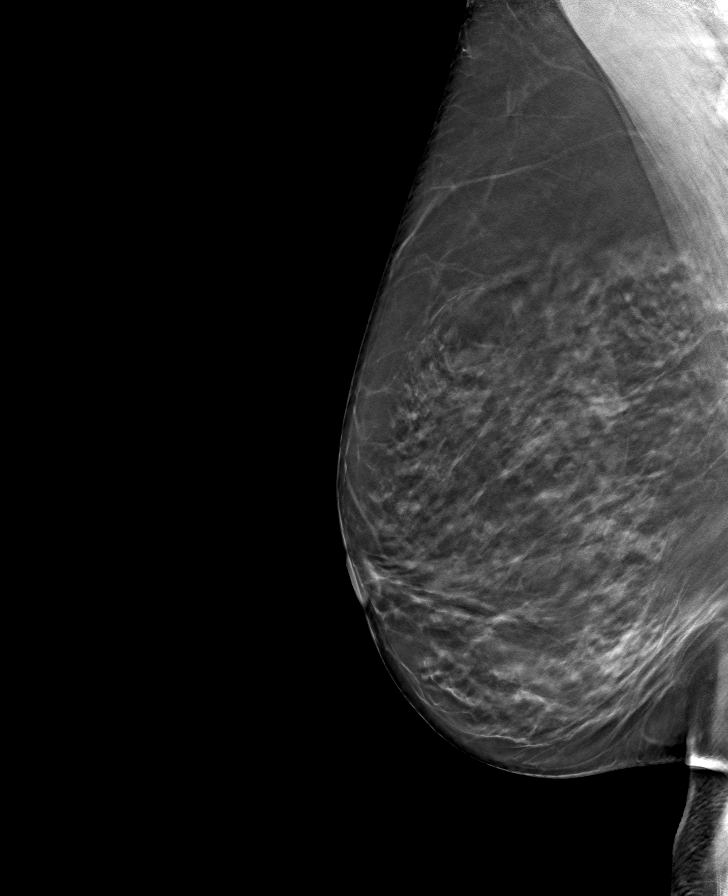

[L MLO tomo · tomo slice 41/82.0]
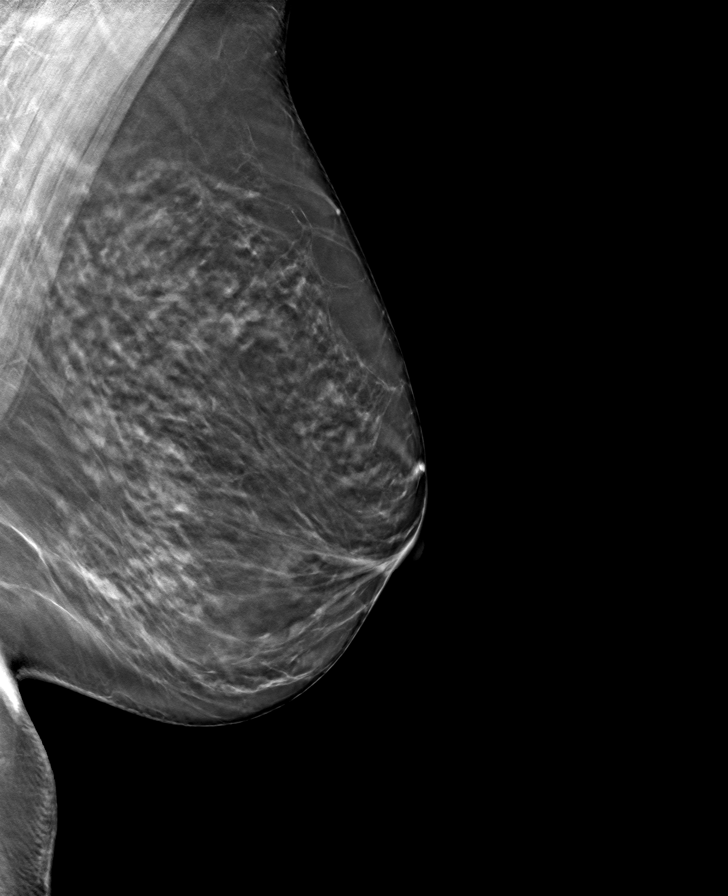

[R CC tomo · tomo slice 43/85.0]
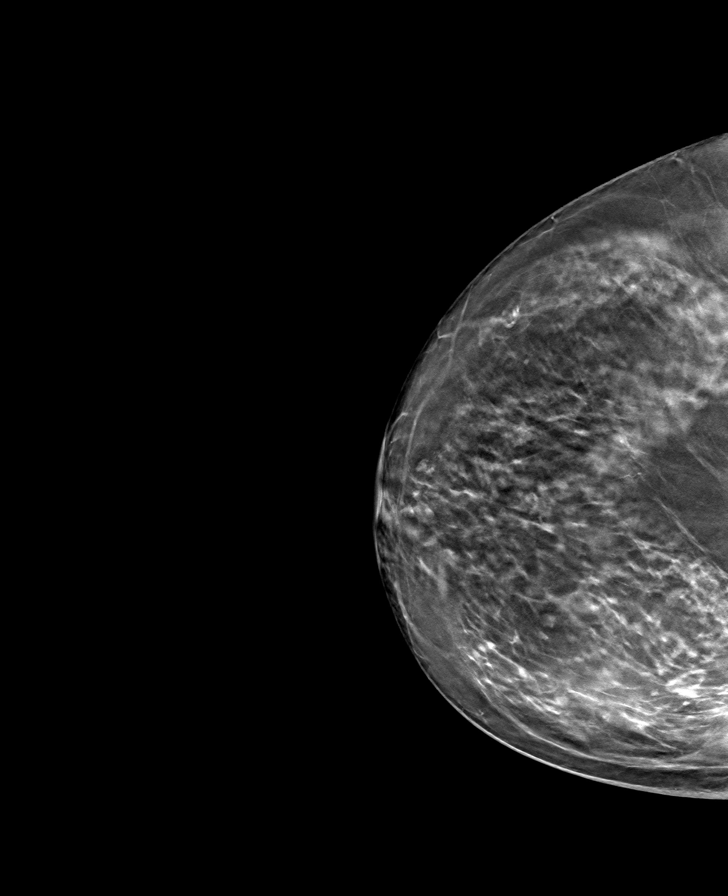

[L CC tomo · tomo slice 47/93.0]
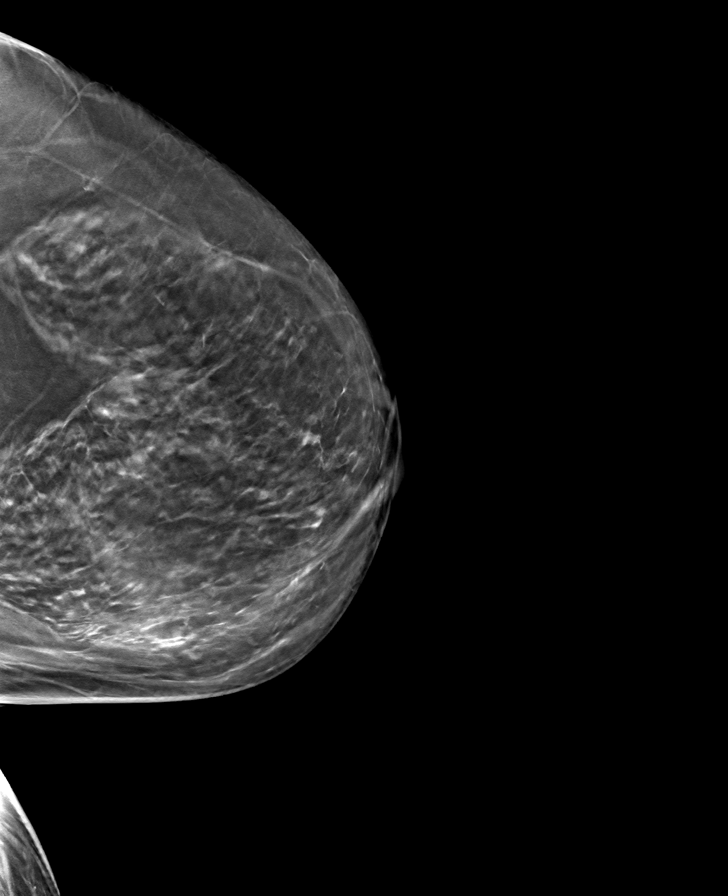

[8 of 24 positions shown; findings below may reference images not displayed]

ACR Breast Density Category c: The breast tissue is heterogeneously
dense, which may obscure small masses.
FINDINGS: There are no findings suspicious for malignancy.
IMPRESSION: No mammographic evidence of malignancy. A result letter of this
screening mammogram will be mailed directly to the patient.

RECOMMENDATION:
Screening mammogram in one year. (Code:Q3-W-BC3)

BI-RADS CATEGORY  1: Negative.

## 2023-11-02 ENCOUNTER — Ambulatory Visit
Admission: RE | Admit: 2023-11-02 | Discharge: 2023-11-02 | Disposition: A | Discharge status: Home / Self Care | Payer: HMO | Source: Ambulatory Visit | Attending: Family Medicine | Admitting: Family Medicine

## 2023-11-02 ENCOUNTER — Ambulatory Visit: Payer: HMO

## 2023-11-02 DIAGNOSIS — R928 Other abnormal and inconclusive findings on diagnostic imaging of breast: Secondary | ICD-10-CM

## 2023-11-06 ENCOUNTER — Telehealth: Payer: Self-pay | Admitting: Diagnostic Neuroimaging

## 2023-11-06 NOTE — Telephone Encounter (Signed)
Pt confirming appt and asked to be put on the wait list

## 2024-01-17 DIAGNOSIS — K146 Glossodynia: Secondary | ICD-10-CM | POA: Diagnosis not present

## 2024-01-17 DIAGNOSIS — I10 Essential (primary) hypertension: Secondary | ICD-10-CM | POA: Diagnosis not present

## 2024-01-17 DIAGNOSIS — R42 Dizziness and giddiness: Secondary | ICD-10-CM | POA: Diagnosis not present

## 2024-01-17 DIAGNOSIS — E039 Hypothyroidism, unspecified: Secondary | ICD-10-CM | POA: Diagnosis not present

## 2024-02-07 ENCOUNTER — Ambulatory Visit: Payer: Self-pay | Admitting: Diagnostic Neuroimaging

## 2024-02-07 ENCOUNTER — Encounter: Payer: Self-pay | Admitting: Diagnostic Neuroimaging

## 2024-02-07 VITALS — BP 141/82 | HR 80 | Ht 67.0 in | Wt 157.0 lb

## 2024-02-07 DIAGNOSIS — K146 Glossodynia: Secondary | ICD-10-CM

## 2024-02-07 NOTE — Progress Notes (Signed)
 [facility]  PATIENT: Katherine Jackson DOB: 1956/03/26  REFERRING CLINICIAN: Glena Landau, MD HISTORY FROM: patient  REASON FOR VISIT: new consult   HISTORICAL  CHIEF COMPLAINT:  Chief Complaint  Patient presents with   Facial Pain    Rm 6 alone Pt is well, reports she has been having mouth pain after a tooth extraction in Dec 2023. She is also experiencing tingling, burning and some swelling on R side. Lyrica does help.     HISTORY OF PRESENT ILLNESS:   68 year old female here for evaluation of burning mouth syndrome.  In 2023 patient had left upper molar extraction and a right lower crown replacement procedure.  Immediately following this she noticed burning sensation in her entire mouth.  She followed up with dentist, oral surgeon, [facility], and no specific cause was found.  In the last few months she has been started on Lyrica 100 mg at bedtime which is slightly improving symptoms.  No problems with arms, feet, legs, neck or low back.  No speech or swallowing difficulties.  No vision changes.   REVIEW OF SYSTEMS: Full 14 system review of systems performed and negative with exception of: as per hPI.  ALLERGIES: No Known Allergies  HOME MEDICATIONS: Outpatient Medications Prior to Visit  Medication Sig Dispense Refill   amLODipine (NORVASC) 5 MG tablet Take 2.5 mg by mouth daily.     atorvastatin (LIPITOR) 20 MG tablet Take 20 mg by mouth daily.     levothyroxine (SYNTHROID) 137 MCG tablet Take 137 mcg by mouth daily before breakfast.     levothyroxine (SYNTHROID) 150 MCG tablet Take 150 mcg by mouth daily before breakfast.     montelukast (SINGULAIR) 10 MG tablet Take 10 mg by mouth daily.     pregabalin (LYRICA) 50 MG capsule Take 100 mg by mouth at bedtime.     triamcinolone  (NASACORT  AQ) 55 MCG/ACT AERO nasal inhaler Place 1 spray into the nose daily. 1 Bottle 2   No facility-administered medications prior to visit.    PAST  MEDICAL HISTORY: Past Medical History:  Diagnosis Date   Borderline diabetes mellitus    History of shingles    Mixed hyperlipidemia    Primary hypothyroidism    Unspecified chronic bronchitis (HCC)     PAST SURGICAL HISTORY: Past Surgical History:  Procedure Laterality Date   BACK SURGERY  2015   CERVICAL SPINE SURGERY  1999    FAMILY HISTORY: Family History  Problem Relation Age of Onset   Breast cancer Mother     SOCIAL HISTORY: Social History   Socioeconomic History   Marital status: Married    Spouse name: Not on file   Number of children: Not on file   Years of education: Not on file   Highest education level: Not on file  Occupational History   Not on file  Tobacco Use   Smoking status: Never   Smokeless tobacco: Never  Substance and Sexual Activity   Alcohol use: Not Currently   Drug use: Not Currently   Sexual activity: Not Currently  Other Topics Concern   Not on file  Social History Narrative   Not on file   Social Drivers of Health   Financial Resource Strain: Not on file  Food Insecurity: Not on file  Transportation Needs: Not on file  Physical Activity: Not on file  Stress: Not on file  Social Connections: Not on file  Intimate Partner Violence: Not on file     PHYSICAL  EXAM  GENERAL EXAM/CONSTITUTIONAL: Vitals:  Vitals:   02/07/24 1125  BP: (!) 141/82  Pulse: 80  Weight: 157 lb (71.2 kg)  Height: 5\' 7"  (1.702 m)   Body mass index is 24.59 kg/m. Wt Readings from Last 3 Encounters:  02/07/24 157 lb (71.2 kg)   Patient is in no distress; well developed, nourished and groomed; neck is supple  CARDIOVASCULAR: Examination of carotid arteries is normal; no carotid bruits Regular rate and rhythm, no murmurs Examination of peripheral vascular system by observation and palpation is normal  EYES: Ophthalmoscopic exam of optic discs and posterior segments is normal; no papilledema or hemorrhages No results  found.  MUSCULOSKELETAL: Gait, strength, tone, movements noted in Neurologic exam below  NEUROLOGIC: MENTAL STATUS:      No data to display         awake, alert, oriented to person, place and time recent and remote memory intact normal attention and concentration language fluent, comprehension intact, naming intact fund of knowledge appropriate  CRANIAL NERVE:  2nd - no papilledema on fundoscopic exam 2nd, 3rd, 4th, 6th - pupils equal and reactive to light, visual fields full to confrontation, extraocular muscles intact, no nystagmus 5th - facial sensation symmetric 7th - facial strength symmetric 8th - hearing intact 9th - palate elevates symmetrically, uvula midline 11th - shoulder shrug symmetric 12th - tongue protrusion midline  MOTOR:  normal bulk and tone, full strength in the BUE, BLE  SENSORY:  normal and symmetric to light touch, temperature, vibration  COORDINATION:  finger-nose-finger, fine finger movements normal  REFLEXES:  deep tendon reflexes TRACE and symmetric  GAIT/STATION:  narrow based gait     DIAGNOSTIC DATA (LABS, IMAGING, TESTING) - I reviewed patient records, labs, notes, testing and imaging myself where available.  No results found for: "WBC", "HGB", "HCT", "MCV", "PLT" No results found for: "NA", "K", "CL", "CO2", "GLUCOSE", "BUN", "CREATININE", "CALCIUM", "PROT", "ALBUMIN", "AST", "ALT", "ALKPHOS", "BILITOT", "GFRNONAA", "GFRAA" No results found for: "CHOL", "HDL", "LDLCALC", "LDLDIRECT", "TRIG", "CHOLHDL" No results found for: "HGBA1C" No results found for: "VITAMINB12" No results found for: "TSH"  B12 362 Iron 86 TSH 0.59  12/09/22 CT maxillofacial 1. Erosive changes within the left posterior maxilla along the alveolar ridge, possibly related to molar extraction, however, the inferior wall of the left maxillary antrum appears intact. 2. Mild focal mucosal thickening impinges upon the ostiomeatal unit. Small layering fluid  within the left maxillary sinus. No additional associated mucosal thickening within the left maxillary antrum.   ASSESSMENT AND PLAN  68 y.o. year old female here with:  Dx:  1. Burning mouth syndrome     PLAN:  Burning mouth syndrome (immediately following dental procedure in 2023) - consider to check B6, zinc, ANA, SSA, SSB (if not already checked) - continue pregabalin 100mg  at bedtime; may increase as tolerated for pain control - consider duloxetine or amitriptyline - follow up with oral surgery re: post-extraction findings on CT maxillofacial  Return for return to PCP, pending if symptoms worsen or fail to improve.    Omega Bible, MD 02/07/2024, 12:04 PM Certified in Neurology, Neurophysiology and Neuroimaging  [facility] [address] [place], [place] [address] [phone]

## 2024-02-07 NOTE — Patient Instructions (Addendum)
  Burning mouth syndrome (immediately following dental procedure in 2023) - consider to check B6, zinc, ANA, SSA, SSB (if not already checked) - continue pregabalin 100mg  at bedtime; may increase as tolerated for pain control - consider duloxetine or amitriptyline

## 2024-02-08 DIAGNOSIS — K146 Glossodynia: Secondary | ICD-10-CM | POA: Diagnosis not present

## 2024-02-08 DIAGNOSIS — R7303 Prediabetes: Secondary | ICD-10-CM | POA: Diagnosis not present

## 2024-02-08 DIAGNOSIS — E782 Mixed hyperlipidemia: Secondary | ICD-10-CM | POA: Diagnosis not present

## 2024-03-09 DIAGNOSIS — B9689 Other specified bacterial agents as the cause of diseases classified elsewhere: Secondary | ICD-10-CM | POA: Diagnosis not present

## 2024-03-09 DIAGNOSIS — J208 Acute bronchitis due to other specified organisms: Secondary | ICD-10-CM | POA: Diagnosis not present

## 2024-03-13 DIAGNOSIS — J988 Other specified respiratory disorders: Secondary | ICD-10-CM | POA: Diagnosis not present

## 2024-03-13 DIAGNOSIS — J441 Chronic obstructive pulmonary disease with (acute) exacerbation: Secondary | ICD-10-CM | POA: Diagnosis not present

## 2024-03-16 ENCOUNTER — Emergency Department (HOSPITAL_BASED_OUTPATIENT_CLINIC_OR_DEPARTMENT_OTHER): Admitting: Radiology

## 2024-03-16 ENCOUNTER — Emergency Department (HOSPITAL_BASED_OUTPATIENT_CLINIC_OR_DEPARTMENT_OTHER)
Admission: EM | Admit: 2024-03-16 | Discharge: 2024-03-17 | Disposition: A | Discharge status: Home / Self Care | Attending: Emergency Medicine | Admitting: Emergency Medicine

## 2024-03-16 ENCOUNTER — Other Ambulatory Visit: Payer: Self-pay

## 2024-03-16 ENCOUNTER — Encounter (HOSPITAL_BASED_OUTPATIENT_CLINIC_OR_DEPARTMENT_OTHER): Payer: Self-pay | Admitting: Emergency Medicine

## 2024-03-16 DIAGNOSIS — R0981 Nasal congestion: Secondary | ICD-10-CM | POA: Insufficient documentation

## 2024-03-16 DIAGNOSIS — Z79899 Other long term (current) drug therapy: Secondary | ICD-10-CM | POA: Diagnosis not present

## 2024-03-16 DIAGNOSIS — I10 Essential (primary) hypertension: Secondary | ICD-10-CM | POA: Diagnosis not present

## 2024-03-16 DIAGNOSIS — E039 Hypothyroidism, unspecified: Secondary | ICD-10-CM | POA: Diagnosis not present

## 2024-03-16 DIAGNOSIS — J209 Acute bronchitis, unspecified: Secondary | ICD-10-CM

## 2024-03-16 DIAGNOSIS — R059 Cough, unspecified: Secondary | ICD-10-CM | POA: Diagnosis not present

## 2024-03-16 LAB — RESP PANEL BY RT-PCR (RSV, FLU A&B, COVID)  RVPGX2
Influenza A by PCR: NEGATIVE
Influenza B by PCR: NEGATIVE
Resp Syncytial Virus by PCR: NEGATIVE
SARS Coronavirus 2 by RT PCR: NEGATIVE

## 2024-03-16 MED ORDER — IPRATROPIUM-ALBUTEROL 0.5-2.5 (3) MG/3ML IN SOLN
3.0000 mL | Freq: Once | RESPIRATORY_TRACT | Status: AC
  Administered 2024-03-16: 3 mL via RESPIRATORY_TRACT
  Filled 2024-03-16: qty 3

## 2024-03-16 NOTE — ED Triage Notes (Signed)
 2 to 3 weeks of cough/congestion. Has been on 3 different antibiotics, not getting better.

## 2024-03-16 NOTE — ED Provider Notes (Signed)
 [place] EMERGENCY DEPARTMENT AT [facility] Provider Note   CSN: [id number] Arrival date & time: 03/16/24  1841     History  Chief Complaint  Patient presents with   Cough    Katherine Jackson is a 68 y.o. female.  Patient is a 68 year old female with past medical history of hypertension, hyperlipidemia, hypothyroidism.  Patient presenting today with complaints of congestion and cough that has been present for the past 3 weeks.  Patient has been on doxycycline and Biaxin as well as prescription cough medication and steroids, but is not improving.  She denies she is having any fevers or chills.  She states that she feels horrible and that her chest is congested.  No fevers or chills.  No ill contacts.       Home Medications Prior to Admission medications   Medication Sig Start Date End Date Taking? Authorizing Provider  amLODipine (NORVASC) 5 MG tablet Take 2.5 mg by mouth daily.    [provider]  atorvastatin (LIPITOR) 20 MG tablet Take 20 mg by mouth daily.    [provider]  levothyroxine (SYNTHROID) 137 MCG tablet Take 137 mcg by mouth daily before breakfast.    [provider]  levothyroxine (SYNTHROID) 150 MCG tablet Take 150 mcg by mouth daily before breakfast.    [provider]  montelukast (SINGULAIR) 10 MG tablet Take 10 mg by mouth daily.    [provider]  pregabalin (LYRICA) 50 MG capsule Take 100 mg by mouth at bedtime.    [provider]      Allergies    Patient has no known allergies.    Review of Systems   Review of Systems  All other systems reviewed and are negative.   Physical Exam Updated Vital Signs BP 137/85   Pulse 71   Temp 98 F (36.7 C) (Oral)   Resp 16   Wt 72.6 kg   SpO2 97%   BMI 25.06 kg/m  Physical Exam Vitals and nursing note reviewed.  Constitutional:      General: She is not in acute distress.    Appearance: She is well-developed. She is not diaphoretic.   HENT:     Head: Normocephalic and atraumatic.  Cardiovascular:     Rate and Rhythm: Normal rate and regular rhythm.     Heart sounds: No murmur heard.    No friction rub. No gallop.  Pulmonary:     Effort: Pulmonary effort is normal. No respiratory distress.     Breath sounds: Normal breath sounds. No wheezing.  Abdominal:     General: Bowel sounds are normal. There is no distension.     Palpations: Abdomen is soft.     Tenderness: There is no abdominal tenderness.  Musculoskeletal:        General: Normal range of motion.     Cervical back: Normal range of motion and neck supple.  Skin:    General: Skin is warm and dry.  Neurological:     General: No focal deficit present.     Mental Status: She is alert and oriented to person, place, and time.     ED Results / Procedures / Treatments   Labs (all labs ordered are listed, but only abnormal results are displayed) Labs Reviewed  RESP PANEL BY RT-PCR (RSV, FLU A&B, COVID)  [id number]    EKG None  Radiology DG Chest 2 View Result Date: 03/16/2024 CLINICAL DATA:  Cough EXAM: CHEST - 2 VIEW COMPARISON:  Chest  x-ray 11/10/2016 FINDINGS: The heart size and mediastinal contours are within normal limits. Both lungs are clear. The visualized skeletal structures are unremarkable. IMPRESSION: No active cardiopulmonary disease. Electronically Signed   By: Tyron Gallon M.D.   On: 03/16/2024 21:19    Procedures Procedures  {Document cardiac monitor, telemetry assessment procedure when appropriate:1}  Medications Ordered in ED Medications  ipratropium-albuterol  (DUONEB) 0.5-2.5 (3) MG/3ML nebulizer solution 3 mL (has no administration in time range)    ED Course/ Medical Decision Making/ A&P   {   Click here for ABCD2, HEART and other calculatorsREFRESH Note before signing :1}                              Medical Decision Making Amount and/or Complexity of Data Reviewed Radiology: ordered.  Risk Prescription drug  management.   ***  {Document critical care time when appropriate:1} {Document review of labs and clinical decision tools ie heart score, Chads2Vasc2 etc:1}  {Document your independent review of radiology images, and any outside records:1} {Document your discussion with family members, caretakers, and with consultants:1} {Document social determinants of health affecting pt's care:1} {Document your decision making why or why not admission, treatments were needed:1} Final Clinical Impression(s) / ED Diagnoses Final diagnoses:  None    Rx / DC Orders ED Discharge Orders     None

## 2024-03-17 NOTE — Discharge Instructions (Signed)
 Continue taking Biaxin as previously prescribed.  Continue other medications as previously prescribed.  Follow-up with primary doctor if not improving in the next few days.

## 2024-03-17 NOTE — ED Notes (Signed)
 Patient and family member said they did not want to wait for the paper work to be printed and promptly left after talking to registration.

## 2024-03-17 NOTE — ED Notes (Signed)
 ED Provider at bedside.

## 2024-05-15 DIAGNOSIS — H2513 Age-related nuclear cataract, bilateral: Secondary | ICD-10-CM | POA: Diagnosis not present

## 2024-05-15 DIAGNOSIS — H43391 Other vitreous opacities, right eye: Secondary | ICD-10-CM | POA: Diagnosis not present

## 2024-05-28 DIAGNOSIS — M23203 Derangement of unspecified medial meniscus due to old tear or injury, right knee: Secondary | ICD-10-CM | POA: Diagnosis not present

## 2024-05-28 DIAGNOSIS — M25562 Pain in left knee: Secondary | ICD-10-CM | POA: Diagnosis not present

## 2024-06-06 DIAGNOSIS — H5213 Myopia, bilateral: Secondary | ICD-10-CM | POA: Diagnosis not present

## 2024-06-06 DIAGNOSIS — H2513 Age-related nuclear cataract, bilateral: Secondary | ICD-10-CM | POA: Diagnosis not present

## 2024-06-06 DIAGNOSIS — H43391 Other vitreous opacities, right eye: Secondary | ICD-10-CM | POA: Diagnosis not present

## 2024-06-10 DIAGNOSIS — M25562 Pain in left knee: Secondary | ICD-10-CM | POA: Diagnosis not present

## 2024-06-10 DIAGNOSIS — M25561 Pain in right knee: Secondary | ICD-10-CM | POA: Diagnosis not present

## 2024-06-10 DIAGNOSIS — G8929 Other chronic pain: Secondary | ICD-10-CM | POA: Diagnosis not present

## 2024-06-10 DIAGNOSIS — M6281 Muscle weakness (generalized): Secondary | ICD-10-CM | POA: Diagnosis not present

## 2024-06-19 DIAGNOSIS — M25561 Pain in right knee: Secondary | ICD-10-CM | POA: Diagnosis not present

## 2024-06-19 DIAGNOSIS — M6281 Muscle weakness (generalized): Secondary | ICD-10-CM | POA: Diagnosis not present

## 2024-06-19 DIAGNOSIS — G8929 Other chronic pain: Secondary | ICD-10-CM | POA: Diagnosis not present

## 2024-06-19 DIAGNOSIS — M25562 Pain in left knee: Secondary | ICD-10-CM | POA: Diagnosis not present

## 2024-07-01 DIAGNOSIS — M25562 Pain in left knee: Secondary | ICD-10-CM | POA: Diagnosis not present

## 2024-07-01 DIAGNOSIS — M6281 Muscle weakness (generalized): Secondary | ICD-10-CM | POA: Diagnosis not present

## 2024-07-01 DIAGNOSIS — M25561 Pain in right knee: Secondary | ICD-10-CM | POA: Diagnosis not present

## 2024-07-03 DIAGNOSIS — J22 Unspecified acute lower respiratory infection: Secondary | ICD-10-CM | POA: Diagnosis not present

## 2024-07-03 DIAGNOSIS — G8929 Other chronic pain: Secondary | ICD-10-CM | POA: Diagnosis not present

## 2024-07-03 DIAGNOSIS — M25562 Pain in left knee: Secondary | ICD-10-CM | POA: Diagnosis not present

## 2024-07-03 DIAGNOSIS — M25561 Pain in right knee: Secondary | ICD-10-CM | POA: Diagnosis not present

## 2024-07-05 ENCOUNTER — Other Ambulatory Visit (HOSPITAL_BASED_OUTPATIENT_CLINIC_OR_DEPARTMENT_OTHER): Payer: Self-pay | Admitting: Family Medicine

## 2024-07-05 DIAGNOSIS — M791 Myalgia, unspecified site: Secondary | ICD-10-CM | POA: Diagnosis not present

## 2024-07-05 DIAGNOSIS — E782 Mixed hyperlipidemia: Secondary | ICD-10-CM

## 2024-07-05 DIAGNOSIS — K146 Glossodynia: Secondary | ICD-10-CM | POA: Diagnosis not present

## 2024-07-05 DIAGNOSIS — J42 Unspecified chronic bronchitis: Secondary | ICD-10-CM | POA: Diagnosis not present

## 2024-07-05 DIAGNOSIS — M2550 Pain in unspecified joint: Secondary | ICD-10-CM | POA: Diagnosis not present

## 2024-07-10 DIAGNOSIS — M25561 Pain in right knee: Secondary | ICD-10-CM | POA: Diagnosis not present

## 2024-07-10 DIAGNOSIS — M25562 Pain in left knee: Secondary | ICD-10-CM | POA: Diagnosis not present

## 2024-07-10 DIAGNOSIS — G8929 Other chronic pain: Secondary | ICD-10-CM | POA: Diagnosis not present

## 2024-07-24 ENCOUNTER — Ambulatory Visit (HOSPITAL_BASED_OUTPATIENT_CLINIC_OR_DEPARTMENT_OTHER)
Admission: RE | Admit: 2024-07-24 | Discharge: 2024-07-24 | Disposition: A | Discharge status: Home / Self Care | Payer: Self-pay | Source: Ambulatory Visit | Attending: Family Medicine | Admitting: Family Medicine

## 2024-07-24 DIAGNOSIS — E782 Mixed hyperlipidemia: Secondary | ICD-10-CM | POA: Insufficient documentation

## 2024-09-02 DIAGNOSIS — G4700 Insomnia, unspecified: Secondary | ICD-10-CM | POA: Diagnosis not present

## 2024-09-02 DIAGNOSIS — J42 Unspecified chronic bronchitis: Secondary | ICD-10-CM | POA: Diagnosis not present

## 2024-09-02 DIAGNOSIS — E782 Mixed hyperlipidemia: Secondary | ICD-10-CM | POA: Diagnosis not present

## 2024-09-02 DIAGNOSIS — Z1331 Encounter for screening for depression: Secondary | ICD-10-CM | POA: Diagnosis not present

## 2024-09-02 DIAGNOSIS — I2510 Atherosclerotic heart disease of native coronary artery without angina pectoris: Secondary | ICD-10-CM | POA: Diagnosis not present

## 2024-09-02 DIAGNOSIS — E039 Hypothyroidism, unspecified: Secondary | ICD-10-CM | POA: Diagnosis not present

## 2024-09-02 DIAGNOSIS — Z0000 Encounter for general adult medical examination without abnormal findings: Secondary | ICD-10-CM | POA: Diagnosis not present

## 2024-09-02 DIAGNOSIS — K146 Glossodynia: Secondary | ICD-10-CM | POA: Diagnosis not present

## 2024-09-02 DIAGNOSIS — R7303 Prediabetes: Secondary | ICD-10-CM | POA: Diagnosis not present

## 2024-09-02 DIAGNOSIS — M2550 Pain in unspecified joint: Secondary | ICD-10-CM | POA: Diagnosis not present

## 2024-09-02 DIAGNOSIS — I10 Essential (primary) hypertension: Secondary | ICD-10-CM | POA: Diagnosis not present

## 2024-09-02 DIAGNOSIS — M8580 Other specified disorders of bone density and structure, unspecified site: Secondary | ICD-10-CM | POA: Diagnosis not present

## 2024-09-19 DIAGNOSIS — N9089 Other specified noninflammatory disorders of vulva and perineum: Secondary | ICD-10-CM | POA: Diagnosis not present

## 2024-09-27 ENCOUNTER — Other Ambulatory Visit: Payer: Self-pay | Admitting: Family Medicine

## 2024-09-27 DIAGNOSIS — Z1231 Encounter for screening mammogram for malignant neoplasm of breast: Secondary | ICD-10-CM

## 2024-10-24 ENCOUNTER — Ambulatory Visit
Admission: RE | Admit: 2024-10-24 | Discharge: 2024-10-24 | Disposition: A | Discharge status: Home / Self Care | Source: Ambulatory Visit | Attending: Family Medicine | Admitting: Family Medicine

## 2024-10-24 DIAGNOSIS — Z1231 Encounter for screening mammogram for malignant neoplasm of breast: Secondary | ICD-10-CM

## 2025-01-10 ENCOUNTER — Ambulatory Visit: Payer: Self-pay
